# Patient Record
Sex: Female | Born: 1965 | Hispanic: No
Health system: Northeastern US, Academic
[De-identification: ages and names within clinical notes are randomized; demographics above are authoritative.]

---

## 2020-02-14 ENCOUNTER — Encounter: Admit: 2020-02-14 | Payer: BLUE CROSS/BLUE SHIELD | Attending: Dermatology

## 2020-02-18 ENCOUNTER — Ambulatory Visit: Admit: 2020-02-18 | Payer: BLUE CROSS/BLUE SHIELD | Attending: Dermatology

## 2020-02-18 ENCOUNTER — Encounter: Admit: 2020-02-18 | Payer: BLUE CROSS/BLUE SHIELD | Attending: Dermatology

## 2020-02-18 ENCOUNTER — Encounter: Admit: 2020-02-18 | Payer: PRIVATE HEALTH INSURANCE | Attending: Dermatology

## 2020-02-18 DIAGNOSIS — D239 Other benign neoplasm of skin, unspecified: Secondary | ICD-10-CM

## 2020-02-18 DIAGNOSIS — L570 Actinic keratosis: Secondary | ICD-10-CM

## 2020-02-18 DIAGNOSIS — L219 Seborrheic dermatitis, unspecified: Secondary | ICD-10-CM

## 2020-02-18 DIAGNOSIS — L814 Other melanin hyperpigmentation: Secondary | ICD-10-CM

## 2020-02-18 DIAGNOSIS — C4441 Basal cell carcinoma of skin of scalp and neck: Secondary | ICD-10-CM

## 2020-02-18 DIAGNOSIS — Z85828 Personal history of other malignant neoplasm of skin: Secondary | ICD-10-CM

## 2020-02-18 DIAGNOSIS — L738 Other specified follicular disorders: Secondary | ICD-10-CM

## 2020-02-18 DIAGNOSIS — D229 Melanocytic nevi, unspecified: Secondary | ICD-10-CM

## 2020-02-18 DIAGNOSIS — Z1283 Encounter for screening for malignant neoplasm of skin: Secondary | ICD-10-CM

## 2020-02-18 DIAGNOSIS — L821 Other seborrheic keratosis: Secondary | ICD-10-CM

## 2020-02-18 DIAGNOSIS — C4491 Basal cell carcinoma of skin, unspecified: Secondary | ICD-10-CM

## 2020-02-18 DIAGNOSIS — L578 Other skin changes due to chronic exposure to nonionizing radiation: Secondary | ICD-10-CM

## 2020-02-18 DIAGNOSIS — D1801 Hemangioma of skin and subcutaneous tissue: Secondary | ICD-10-CM

## 2020-02-18 MED ORDER — KETOCONAZOLE 2 % SHAMPOO
2 % | TOPICAL | 12 refills | Status: AC
Start: 2020-02-18 — End: ?

## 2020-02-18 MED ORDER — CLOBETASOL 0.05 % SCALP SOLUTION
0.05 % | 4 refills | Status: AC
Start: 2020-02-18 — End: ?

## 2020-02-18 NOTE — Progress Notes
CC: Chief Complaint Patient presents with ? Total Body Skin Exam   Itchy scalp present for several months  Derm Hx: last visit 02/13/2019 (Dr. Jaynie Collins) for TBSE# basal cell carcinoma on the left temporal scalp s/p MOHs sx 08/2017?2. Cherry angiomaBenign: no further treatment necessary 3. Multiple benign melanocytic nevi of upper and lower extremities and trunkBenign: no further treatment necessary4. Dermal nevusBenign: no further treatment necessary5. DermatofibromaBenign: no further treatment necessary6. Seborrheic keratosesBenign: no further treatment necessary7. Sebaceous hyperplasiaBenign: no further treatment necessary8. Solar lentigoBenign: no further treatment necessary----------------------------------------------------------------------------HPI: Melanie Hodge is a 54 y.o. female who presents for total body skin examination.The patient has a new lesion of concern.LOC #1:Anatomic Location: scalpDuration: >2 month(s)Symptom(s): itchy bump on skinChange(s): no changes; comes and goes Prior treatment(s): head and shoulders shampooAssociated signs or symptoms: noReview of Systems on February 18, 2020:Positive for rashPt was asked about and denies: weight loss, fever, chills, night sweats, cough, chest pain, shortness of breath, abdominal pain, urinary problems, diarrhea, N/V, headache, arthritis, weakness, changes in vision, swollen lymph nodes, bleeding tendencies, depression/anxiety.?Personal hx of skin cancer: basal cell carcinoma on the left temporal scalp s/p MOHs sx 08/2017?Family hx of skin cancer: nonePrior hx of blistering sunburns: noPrior hx of tanning bed use: noCurrent sunscreen use: yes The patient's medications, allergies, problem list, and past medical, surgical and family histories were reviewed and updated as appropriate.Objective:Physical ExamThe patient is a 54 y.o. female in no acute distressWell nourished, well developed. Alert and oriented x 3Fitzpatrick phototype II-III skin with moderate actinic damage.Total body skin examination performed including the hair, scalp, eyelids, ears, lips, eyes, face, neck, RUE, LUE, axillae, chest, abdomen, back, groin, buttocks, RLE, LLE, and fingernails/toenails (if no opaque nail polish). All areas were examined with inspection and/or palpation of the areas. - R occipital scalp; are of itch: 2cm minimally pink scaly patch c/w seborrheic dermatitis - Forehead: flesh colored umbilicated P c/w sebaceous hyperplasia - L temple within hairline: well healed scar, NER - L anterior distal thigh: 8mm pink brown firm P with positive dimple sign - Toenails: Opaque polish - Brown, waxy, stuck-on appearing papules on neck, trunk and extremities. Scattered symmetric brown macules and papules on face, neck, trunk and extremities. Scattered red domed papules on trunk and extremities. No other concerning lesions on examination of head, neck, chest, back, abdomen, bilateral upper or bilateral lower extremities.Key: M=macule, P=papule, IP=inflammatory papule, N=nodule, PQ=plaque,  EP=eczematous patch, L=lichenified, V=verrucous, OC=open comedone, CC=closed comedone, C=clearNurse Jenna Sedlak present during the exam. Assessment/Plan:# Total body skin examination: no biopsies# seborrheic dermatitis of the scalp- ketoconazole shampoo every other day to daily. Discussed that frequent shampooing is beneficial for treating dandruff. May alternate with OTC shampoo of patient's choice.- For scalp, clobetasol solution (depending coverage) BID x 1 week PRN itch/flaking scalp and repeat PRN- if no improvement can consider betamethasone lotion or derma-smoothe oil overnight then wash off in morning- other OTC shampoos that help remove scale include Neutrogena TSal and TGel shampoos- discussed side effects of topical steroids, including skin-thinning/atrophy, striae, telangiectasias and hypopigmentation. Patient informed not to apply to face/axillae/groin/other skin folds, and not to use daily for >14 days due to risks of side effects  # Dermatofibromas: - The benign nature of this slow-growing firm pigmented papule was discussed with the patient.- Dermatofibromas often develop at sites of previous trauma.- If any changes are noted over these papules (tenderness, enlargement or bleeding), then the patient will schedule an appointment to have evaluated and treated- If the dermatofibroma becomes tender/irritating, then potential treatments include  LN2 which may help to flatten the lesion (but will not resolve it), shave/punch biopsy (risk of recurrence) or excision.  # Sebaceous hyperplasia: - Benign enlargement of the sebaceous gland was reviewed with the patient.- Treatment options are often ineffective, especially for numerous lesions.- Destructive modalities vs vitamin A analog therapy was discussed.- If lesion(s) becomes symptomatic in any way, pt to let me know and we can biopsy/treat accordingly # Seborrheic keratoses: - Benign growth of keratinocytes reviewed with patient.- The sites will be closely observed for clinical changes.- Patient to call for re-evaluation if the keratosis bleeds, becomes tender to touch or changes in any other way.# Cherry hemangioma- Benign growth of cutaneous blood vessels was reviewed with patient.- Patient to call for re-evaluation if the cherry hemangiomas bleed, become tender to touch or change in any other way.#  Skin examination as outlined above with benign nevi, seborrheic keratoses and lentigines- no further treatment is required for these benign lesions- the patient was advised to call for re-evaluation if a change is noted in color, shape, size or if any lesion becomes symptomatic (itch, bleed, become tender)# Personal history of skin cancer: NER- no evidence of recurrence- will continue to monitor all sites of prior skin cancer during routine exams# Patient counseling on UV protection/skin cancer prevention:- the patient was counseled on increased risk of skin cancer in patients with diffuse sun damage or prior history of skin cancer, and need for regular skin examinations- the ABCDE's of melanoma were reviewed as well as self-skin examinations- the patient was educated on sun avoidance behavior including sun protective clothing, broad-rimmed hit, use of SPF 30 or above with frequent reapplication, avoiding the sun during mid-day hours; handout distributed with recommendationsRETURN: 1 year, return sooner if new lesion of concern should ariseScribed for Pollyann Savoy, MD by Miguel Aschoff, medical scribe August 9, 2021The documentation recorded by the scribe accurately reflects the services I personally performed and the decisions made by me. I reviewed and confirmed all material entered and/or pre-charted by the scribe. Pollyann Savoy, MD, PhD8/03/2020 4:23 PMElectronically Signed by Pollyann Savoy, MD, February 18, 2020 Meds: Current Outpatient Medications Medication Sig Dispense Refill ? cholecalciferol, vitamin D3, (VITAMIN D3 ORAL) Take by mouth   ? ferrous sulfate (IRON ORAL) Take 27 mg by mouth   ? MULTIVITAMIN ORAL Take by mouth   ? pravastatin (PRAVACHOL) 20 MG tablet take 1 tablet by mouth once daily  0 ? clobetasoL (TEMOVATE) 0.05 % external solution Apply nightly x 2 weeks, then decrease to twice weekly for maintenance 50 mL 3 ? ketoconazole (NIZORAL) 2 % shampoo Apply topically every other day. Lather and leave on scalp x 14m in shower, then wash out. Repeat 3 times per week. 120 mL 11 No current facility-administered medications for this visit.  Allergy: No Known AllergiesPMH:Past Medical History: Diagnosis Date ? Basal cell carcinoma  ? Basal cell carcinoma, scalp/neck 08/22/2017 ? Keratosis, actinic  FH: family history is not on file.VW:[id number] History Social History Narrative ? Not on file Tobacco Use ? Smoking status: Never Smoker ? Smokeless tobacco: Never Used Substance Use Topics ? Alcohol use: Not on file ? Drug use: Not on file

## 2020-02-18 NOTE — Progress Notes
Review of Systems on February 18, 2020:Positive for rashPt was asked about and denies: weight loss, fever, chills, night sweats, cough, chest pain, shortness of breath, abdominal pain, urinary problems, diarrhea, N/V, headache, arthritis, weakness, changes in vision, swollen lymph nodes, bleeding tendencies, depression/anxiety.Personal hx of skin cancer: basal cell carcinoma on the left temporal scalp s/p MOHs sx 08/2017?Family hx of skin cancer: nonePrior hx of blistering sunburns: noPrior hx of tanning bed use: noCurrent sunscreen use: yes

## 2020-02-18 NOTE — Patient Instructions
Dr. Alain Honey specializes in cosmetic dermatology including Botox and fillers, lasers (pulse dye laser for cherry angiomas), and dermatologic surgery. If you would like to schedule a consultation with her, please call [phone].[facility] [address], [address] [place], [place] [address]# Seborrheic dermatitis (dandruff / itchy scalp)- ketoconazole (NIZORAL) 2 % shampoo; Apply topically every other day. Lather and leave on scalp x 27m in shower, then wash out. Repeat 3 times per week.  Dispense: 120 mL; Refill: 11- clobetasoL (TEMOVATE) 0.05 % external solution; Apply nightly x 2 weeks, then decrease to twice weekly for maintenance  Dispense: 50 mL; Refill: 3 Side effects of topical steroids such as clobetasol, betamethasone, triamcinolone, mometasone, fluocinolone, hydrocortisone, and others include:?	skin-thinning (atrophy)?	stretch marks (striae)?	dilated blood vessels (telangiectasias)?	whitening/lightening of the skin (hypopigmentation)Do not apply to face/armpits/groin/other skin folds, and do not to use every day for more than 14 days in a row due to risks of side effects. Cherry AngiomaA cherry angioma is a harmless growth on the skin. It is made up of blood vessels. Cherry angiomas can appear anywhere on the body, but they usually appear on the trunk and arms.What are the causes?The cause of this condition is not known, but it seems to be related to advancing age.What increases the risk?You are more likely to develop this condition if you:?	Are over the age of 70.?	Have a family member with this condition.What are the signs or symptoms??	Symptoms of this condition include harmless growths that are:?	Smooth, round, and red or purplish-red.?	As small as the tip of a pin or as big as a pencil eraser.How is this diagnosed?This condition is diagnosed with a skin exam. Rarely, a piece of the cherry angioma may be removed for testing if it is not clear that the growth is a cherry angioma.How is this treated?Treatment is not needed for this condition. If you do not like the way a cherry angioma looks, you may have it removed. Removal methods include:?	A method where heat is used to burn the cherry angioma off the skin (electrocautery).?	A method where the cherry angioma is frozen (cryosurgery). This causes it to eventually fall off the skin.?	A method where a laser is used to destroy the red blood cells and blood vessels in the angioma (laser therapy).?	A minor surgical procedure. A scalpel is used to remove the cherry angioma off the skin.A cherry angioma may come back after it has been removed.Follow these instructions at home:?	If you have a cherry angioma removed, keep the area clean and follow any other care instructions as told by your health care provider.?	Take over-the-counter and prescription medicines only as told by your health care provider.?	Keep all follow-up visits as told by your health care provider. This is important.Summary?	A cherry angioma is a harmless growth on the skin that is made up of blood vessels.?	Treatment is not needed for this condition.?	If you do not like the way a cherry angioma looks, you may have it removed.?	If you have a cherry angioma removed, follow any care instructions as told by your health care provider.This information is not intended to replace advice given to you by your health care provider. Make sure you discuss any questions you have with your health care provider.Document Revised: 01/17/2018 Document Reviewed: 07/09/2019Elsevier Patient Education ? 2021 [employer]Sunscreen recommendations:You should choose a suscreen that is at least an SPF 30. Make sure that the sunscreen you choose has broad spectrum, written on the label, meaning it covers both UVA and UVA types of radiation. All sunscreens should be applied every 2 hours  and after swimming, toweling or sweating.Physical sunscreens contain mineral compounds like zinc oxide or titanium dioxide that sit on top of the skin and deflect UV radiation. These are the traditional sunscreens that turn you white but have excellent UV coverage. Chemical sunscreens have various active chemical compounds to protect against UVA and/or UVB.??Wynelle Link Protection Recommendations?5.	Use a daily sunscreen that is labeled as ?broad-spectrum, which covers both  UVA and UVB sun rays. The sunscreen should be at least SPF 30.?2.	Reapply the sunscreen every 2 hours when outdoors and after getting wet, sweating or toweling off.?3.	Use at least one fluid ounce (size of a golf ball) of sunscreen for every application to cover all sun-exposed areas.?4.	Don?t forget to apply sunscreen to your face, ears and neck, as well as your arms and legs if they are not covered by clothing.?5.	Seek shade between 10 a.m. and 4 p.m., which are the peak hours of UVB rays.?6.	Wear lightweight long-sleeved shirts and pants when possible. There are now numerous clothing lines that contain SPF protection built into the clothing.?7.	Wear a wide-brimmed hat and sunglasses whenever possible.?8.	Sunscreens can contain chemical sunscreens or physical blockers. Physical blockers include titanium dioxide and zinc oxide. Look at the ingredients on the bottle label to see which type of sunscreen is contained in that bottle.?9.	If using spray sunscreen, be sure that you are applying an even layer on all sun-exposed skin and rub in the sunscreen after spraying.??Moles (melanocytic nevi)?These are collections of pigment cells in the skin. They can be flat or elevated.Melanoma is a form of malignant cancer that can arise on its own or from a pre-existing mole and can ultimately lead to metastasis and even death.?To distinguish a possible melanoma from a mole use the ABCDE rules:A - Asymmetry - Most moles are symmetric (they look the same on both sides), melanomas do notB - Border irregularity - Most moles have a smooth border, melanomas may have an irregular borderC - Color variegation - Most moles have one or two colors which are symmetric (e.g. darker center lighter around the edges), melanomas may                     have several irregularly placed colors (e.g. brown, black, red, white)D - Diameter - Most moles are less than 6 mm (size of a pencil eraser), melanomas can be larger (or smaller)E - Evolution - Melanoma can grow rapidly so watch out for any rapidly growing spot, even ones without brown color (some melanomas are skin colored and some are red)?The growth of hair within moles is normal, is not concerning and it does not increase the risk of melanoma. Always protect yourself from the suns rays as it increases the risk of melanoma. Do NOT use tanning beds, they increase the risk even more!Please call for reevaluation if you develop new spots on your skin that you are concerned about, or if any of your skin spots change in color, start to bleed, become tender to touch or change in any other way.

## 2021-02-16 ENCOUNTER — Ambulatory Visit: Admit: 2021-02-16 | Payer: BLUE CROSS/BLUE SHIELD | Attending: Dermatology

## 2021-02-16 ENCOUNTER — Encounter: Admit: 2021-02-16 | Payer: PRIVATE HEALTH INSURANCE | Attending: Dermatology

## 2021-02-16 DIAGNOSIS — L219 Seborrheic dermatitis, unspecified: Secondary | ICD-10-CM

## 2021-02-16 DIAGNOSIS — L570 Actinic keratosis: Secondary | ICD-10-CM

## 2021-02-16 DIAGNOSIS — L821 Other seborrheic keratosis: Secondary | ICD-10-CM

## 2021-02-16 DIAGNOSIS — Z1283 Encounter for screening for malignant neoplasm of skin: Secondary | ICD-10-CM

## 2021-02-16 DIAGNOSIS — L738 Other specified follicular disorders: Secondary | ICD-10-CM

## 2021-02-16 DIAGNOSIS — D229 Melanocytic nevi, unspecified: Secondary | ICD-10-CM

## 2021-02-16 DIAGNOSIS — C4441 Basal cell carcinoma of skin of scalp and neck: Secondary | ICD-10-CM

## 2021-02-16 DIAGNOSIS — L814 Other melanin hyperpigmentation: Secondary | ICD-10-CM

## 2021-02-16 DIAGNOSIS — Z85828 Personal history of other malignant neoplasm of skin: Secondary | ICD-10-CM

## 2021-02-16 DIAGNOSIS — C4491 Basal cell carcinoma of skin, unspecified: Secondary | ICD-10-CM

## 2021-02-16 DIAGNOSIS — D1801 Hemangioma of skin and subcutaneous tissue: Secondary | ICD-10-CM

## 2021-02-16 DIAGNOSIS — L578 Other skin changes due to chronic exposure to nonionizing radiation: Secondary | ICD-10-CM

## 2021-02-16 DIAGNOSIS — D239 Other benign neoplasm of skin, unspecified: Secondary | ICD-10-CM

## 2021-02-16 NOTE — Patient Instructions
Dr. Alain Honey specializes in cosmetic dermatology including Botox and fillers, lasers, and dermatologic surgery. If you would like to schedule a consultation with her, please call [phone]. Pulse Dye laser for cherry angiomas. [facility] Dermatologic Surgery [facility] [address], [address] [place], [place] [id number] recommendations:You should choose a suscreen that is at least an SPF 30. Make sure that the sunscreen you choose has broad spectrum, written on the label, meaning it covers both UVA and UVA types of radiation. All sunscreens should be applied every 2 hours and after swimming, toweling or sweating.Physical sunscreens contain mineral compounds like zinc oxide or titanium dioxide that sit on top of the skin and deflect UV radiation. These are the traditional sunscreens that turn you white but have excellent UV coverage. Chemical sunscreens have various active chemical compounds to protect against UVA and/or UVB.  Sun Protection Recommendations Use a daily sunscreen that is labeled as ?broad-spectrum, which covers both  UVA and UVB sun rays. The sunscreen should be at least SPF 30. Reapply the sunscreen every 2 hours when outdoors and after getting wet, sweating or toweling off. Use at least one fluid ounce (size of a golf ball) of sunscreen for every application to cover all sun-exposed areas. Don?t forget to apply sunscreen to your face, ears and neck, as well as your arms and legs if they are not covered by clothing. Seek shade between 10 a.m. and 4 p.m., which are the peak hours of UVB rays. Wear lightweight long-sleeved shirts and pants when possible. There are now numerous clothing lines that contain SPF protection built into the clothing. Wear a wide-brimmed hat and sunglasses whenever possible. Sunscreens can contain chemical sunscreens or physical blockers. Physical blockers include titanium dioxide and zinc oxide. Look at the ingredients on the bottle label to see which type of sunscreen is contained in that bottle. If using spray sunscreen, be sure that you are applying an even layer on all sun-exposed skin and rub in the sunscreen after spraying.  Moles (melanocytic nevi) These are collections of pigment cells in the skin. They can be flat or elevated.Melanoma is a form of malignant cancer that can arise on its own or from a pre-existing mole and can ultimately lead to metastasis and even death. To distinguish a possible melanoma from a mole use the ABCDE rules:A - Asymmetry - Most moles are symmetric (they look the same on both sides), melanomas do notB - Border irregularity - Most moles have a smooth border, melanomas may have an irregular borderC - Color variegation - Most moles have one or two colors which are symmetric (e.g. darker center lighter around the edges), melanomas may                     have several irregularly placed colors (e.g. brown, black, red, white)D - Diameter - Most moles are less than 6 mm (size of a pencil eraser), melanomas can be larger (or smaller)E - Evolution - Melanoma can grow rapidly so watch out for any rapidly growing spot, even ones without brown color (some melanomas are skin colored and some are red) The growth of hair within moles is normal, is not concerning and it does not increase the risk of melanoma. Always protect yourself from the suns rays as it increases the risk of melanoma. Do NOT use tanning beds, they increase the risk even more!Please call for reevaluation if you develop new spots on your skin that you are concerned about, or if  any of your skin spots change in color, start to bleed, become tender to touch or change in any other way.

## 2021-02-16 NOTE — Progress Notes
CC: Chief Complaint Patient presents with ? Total Body Skin Exam Derm Hx: last visit 02/18/2020 (Dr. 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 Discussed that frequent shampooing is beneficial for treating dandruff. May alternate with OTC shampoo of patient's choice.- For scalp, clobetasol solution BID x 1 week PRN itch/flaking scalp and repeat PRN- if no improvement can consider betamethasone lotion or derma-smoothe oil overnight then wash off in morning- other OTC shampoos that help remove scale include Neutrogena TSal and TGel shampoos- discussed side effects of topical steroids, including skin-thinning/atrophy, striae, telangiectasias and hypopigmentation. Patient informed not to apply to face/axillae/groin/other skin folds, and not to use daily for >14 days due to risks of side effects  # Dermatofibromas# Sebaceous hyperplasia# Seborrheic keratoses, CHs----------------------------------------------------------------------------HPI: Melanie Hodge is a 55 y.o. female who presents for total body skin examination.Today:- no specific lesions of concern today- has not used ketoconazole for months, feels that the ketoconazole shampoo plus the clobetasol solution worked very well after last visit to resolve the scaly and itchy areas on the scalp, has not needed them sinceReview of Systems on February 16, 2021:Positive for rashPt was asked about and denies: weight loss, fever, chills, night sweats, cough, chest pain, shortness of breath, abdominal pain, urinary problems, diarrhea, N/V, headache, arthritis, weakness, changes in vision, swollen lymph nodes, bleeding tendencies, depression/anxiety.?Personal hx of skin cancer: basal cell carcinoma on the left temporal scalp s/p [facility] 08/2017?Family hx of skin cancer: nonePrior hx of blistering sunburns: noPrior hx of tanning bed use: noCurrent sunscreen use: yes The patient's medications, allergies, problem list, and past medical, surgical and family histories were reviewed and updated as appropriate.Objective:Physical ExamThe patient is a 55 y.o. female in no acute distressWell nourished, well developed. Alert and oriented x 3Fitzpatrick phototype II-III skin with moderate actinic damage.Total body skin examination performed including the hair, scalp, eyelids, ears, lips, eyes, face, neck, RUE, LUE, axillae, chest, abdomen, back, groin, buttocks, RLE, LLE, and fingernails/toenails (if no opaque nail polish). All areas were examined with inspection and/or palpation of the areas. - R occipital scalp: clear- opaque finger and toenail polish- left temple within the hairline: well healed scar, NER- forehead: pink, yellow, umbilicated papules, c/w seb hyp- chest: cherry angiomas - anterior distal thigh: 8 mm pink, brown firm papule c/w DF- left lateral thigh: 9 mm pink, brown, firm P with positive dimple sign, c/w DF- right lateral ankle: 4mm angulate erosion c/w excoriation - Brown, waxy, stuck-on appearing papules on neck, trunk and extremities. Scattered symmetric brown macules and papules on face, neck, trunk and extremities. Scattered red domed papules on trunk and extremities. No other concerning lesions on examination of head, neck, chest, back, abdomen, bilateral upper or bilateral lower extremities.Key: M=macule, P=papule, IP=inflammatory papule, N=nodule, PQ=plaque,  EP=eczematous patch, L=lichenified, V=verrucous, OC=open comedone, CC=closed comedone, C=clearNurse Meagan Marroquin was present in room for physical exam.Assessment/Plan:# Total body skin examination: no biopsies# seborrheic dermatitis of the scalp, currently resolvedIF flares, okay to restart:- ketoconazole shampoo every other day to daily. Discussed that frequent shampooing is beneficial for treating dandruff. May alternate with OTC shampoo of patient's choice.- For scalp, clobetasol solution (depending coverage) BID x 1 week PRN itch/flaking scalp and repeat PRN- if no improvement can consider betamethasone lotion or derma-smoothe oil overnight then wash off in morning- other OTC shampoos that help remove scale include Neutrogena TSal and TGel shampoos- discussed side effects of topical steroids, including skin-thinning/atrophy,  striae, telangiectasias and hypopigmentation. Patient informed not to apply to face/axillae/groin/other skin folds, and not to use daily for >14 days due to risks of side effects  # Dermatofibromas: - The benign nature of this slow-growing firm pigmented papule was discussed with the patient.- Dermatofibromas often develop at sites of previous trauma.- If any changes are noted over these papules (tenderness, enlargement or bleeding), then the patient will schedule an appointment to have evaluated and treated- If the dermatofibroma becomes tender/irritating, then potential treatments include  LN2 which may help to flatten the lesion (but will not resolve it), shave/punch biopsy (risk of recurrence) or excision.  # Sebaceous hyperplasia: - Benign enlargement of the sebaceous gland was reviewed with the patient.- Treatment options are often ineffective, especially for numerous lesions.- Destructive modalities vs vitamin A analog therapy was discussed.- If lesion(s) becomes symptomatic in any way, pt to let me know and we can biopsy/treat accordingly # Seborrheic keratoses: - Benign growth of keratinocytes reviewed with patient.- The sites will be closely observed for clinical changes.- Patient to call for re-evaluation if the keratosis bleeds, becomes tender to touch or changes in any other way.# Cherry hemangioma- Benign growth of cutaneous blood vessels was reviewed with patient.- Patient to call for re-evaluation if the cherry hemangiomas bleed, become tender to touch or change in any other way.- information regarding pulsed dye laser provided#  Skin examination as outlined above with benign nevi, seborrheic keratoses and lentigines- no further treatment is required for these benign lesions- the patient was advised to call for re-evaluation if a change is noted in color, shape, size or if any lesion becomes symptomatic (itch, bleed, become tender)# Personal history of skin cancer: NER- no evidence of recurrence- will continue to monitor all sites of prior skin cancer during routine exams# Patient counseling on UV protection/skin cancer prevention:- the patient was counseled on increased risk of skin cancer in patients with diffuse sun damage or prior history of skin cancer, and need for regular skin examinations- the ABCDE's of melanoma were reviewed as well as self-skin examinations- the patient was educated on sun avoidance behavior including sun protective clothing, broad-rimmed hit, use of SPF 30 or above with frequent reapplication, avoiding the sun during mid-day hours; handout distributed with recommendationsRETURN: 1 year, return sooner if new lesion of concern should ariseScribed for Pollyann Savoy, MD by Elizebeth Brooking, medical scribe August 8, 2022The documentation recorded by the scribe accurately reflects the services I personally performed and the decisions made by me. I reviewed and confirmed all material entered and/or pre-charted by the scribe.Scribed for Pollyann Savoy, MD by Sande Brothers, medical scribe August 8, 2022The documentation recorded by the scribe accurately reflects the services I personally performed and the decisions made by me. I reviewed and confirmed all material entered and/or pre-charted by the scribe. Pollyann Savoy, MD, PhD8/02/2021 4:28 PMElectronically Signed by Pollyann Savoy, MD, February 16, 2021 Meds: Current Outpatient Medications Medication Sig Dispense Refill ? cholecalciferol, vitamin D3, (VITAMIN D3 ORAL) Take by mouth   ? clobetasoL (TEMOVATE) 0.05 % external solution Apply nightly x 2 weeks, then decrease to twice weekly for maintenance 50 mL 3 ? ferrous sulfate (IRON ORAL) Take 27 mg by mouth   ? ketoconazole (NIZORAL) 2 % shampoo Apply topically every other day. Lather and leave on scalp x 51m in shower, then wash out. Repeat 3 times per week. 120 mL 11 ? MULTIVITAMIN ORAL Take by mouth   ? pravastatin (PRAVACHOL) 20 MG tablet take 1 tablet by mouth once  daily  0 No current facility-administered medications for this visit. Allergy: No Known AllergiesPMH:Past Medical History: Diagnosis Date ? Basal cell carcinoma  ? Basal cell carcinoma, scalp/neck 08/22/2017 ? Keratosis, actinic  FH: family history is not on file.ZO:XWRUEA History Social History Narrative ? Not on file Tobacco Use ? Smoking status: Never Smoker ? Smokeless tobacco: Never Used Vaping Use ? Vaping Use: Never used Substance Use Topics ? Alcohol use: Not on file ? Drug use: Not on file

## 2021-02-16 NOTE — Progress Notes
Review of Systems on February 16, 2021:Positive for rashPt was asked about and denies: weight loss, fever, chills, night sweats, cough, chest pain, shortness of breath, abdominal pain, urinary problems, diarrhea, N/V, headache, arthritis, weakness, changes in vision, swollen lymph nodes, bleeding tendencies, depression/anxiety.Personal hx of skin cancer: basal cell carcinoma on the left temporal scalp s/p [facility] 08/2017?Family hx of skin cancer: nonePrior hx of blistering sunburns: noPrior hx of tanning bed use: noCurrent sunscreen use: yes

## 2022-02-15 ENCOUNTER — Encounter: Admit: 2022-02-15 | Payer: PRIVATE HEALTH INSURANCE | Attending: Dermatology

## 2022-02-15 ENCOUNTER — Ambulatory Visit: Admit: 2022-02-15 | Payer: PRIVATE HEALTH INSURANCE | Attending: Dermatology

## 2022-02-15 DIAGNOSIS — D1801 Hemangioma of skin and subcutaneous tissue: Secondary | ICD-10-CM

## 2022-02-15 DIAGNOSIS — L814 Other melanin hyperpigmentation: Secondary | ICD-10-CM

## 2022-02-15 DIAGNOSIS — L570 Actinic keratosis: Secondary | ICD-10-CM

## 2022-02-15 DIAGNOSIS — D229 Melanocytic nevi, unspecified: Secondary | ICD-10-CM

## 2022-02-15 DIAGNOSIS — C4491 Basal cell carcinoma of skin, unspecified: Secondary | ICD-10-CM

## 2022-02-15 DIAGNOSIS — Z85828 Personal history of other malignant neoplasm of skin: Secondary | ICD-10-CM

## 2022-02-15 DIAGNOSIS — D239 Other benign neoplasm of skin, unspecified: Secondary | ICD-10-CM

## 2022-02-15 DIAGNOSIS — L821 Other seborrheic keratosis: Secondary | ICD-10-CM

## 2022-02-15 DIAGNOSIS — C4441 Basal cell carcinoma of skin of scalp and neck: Secondary | ICD-10-CM

## 2022-02-15 DIAGNOSIS — L738 Other specified follicular disorders: Secondary | ICD-10-CM

## 2022-02-15 NOTE — Patient Instructions
Sunscreen recommendations:You should choose a suscreen that is at least an SPF 30. Make sure that the sunscreen you choose has broad spectrum, written on the label, meaning it covers both UVA and UVA types of radiation. All sunscreens should be applied every 2 hours and after swimming, toweling or sweating.Physical sunscreens contain mineral compounds like zinc oxide or titanium dioxide that sit on top of the skin and deflect UV radiation. These are the traditional sunscreens that turn you white but have excellent UV coverage. Chemical sunscreens have various active chemical compounds to protect against UVA and/or UVB.  Sun Protection Recommendations Use a daily sunscreen that is labeled as ?broad-spectrum, which covers both  UVA and UVB sun rays. The sunscreen should be at least SPF 30. Reapply the sunscreen every 2 hours when outdoors and after getting wet, sweating or toweling off. Use at least one fluid ounce (size of a golf ball) of sunscreen for every application to cover all sun-exposed areas. Don?t forget to apply sunscreen to your face, ears and neck, as well as your arms and legs if they are not covered by clothing. Seek shade between 10 a.m. and 4 p.m., which are the peak hours of UVB rays. Wear lightweight long-sleeved shirts and pants when possible. There are now numerous clothing lines that contain SPF protection built into the clothing. Wear a wide-brimmed hat and sunglasses whenever possible. Sunscreens can contain chemical sunscreens or physical blockers. Physical blockers include titanium dioxide and zinc oxide. Look at the ingredients on the bottle label to see which type of sunscreen is contained in that bottle. If using spray sunscreen, be sure that you are applying an even layer on all sun-exposed skin and rub in the sunscreen after spraying.  Moles (melanocytic nevi) These are collections of pigment cells in the skin. They can be flat or elevated.Melanoma is a form of malignant cancer that can arise on its own or from a pre-existing mole and can ultimately lead to metastasis and even death. To distinguish a possible melanoma from a mole use the ABCDE rules:A - Asymmetry - Most moles are symmetric (they look the same on both sides), melanomas do notB - Border irregularity - Most moles have a smooth border, melanomas may have an irregular borderC - Color variegation - Most moles have one or two colors which are symmetric (e.g. darker center lighter around the edges), melanomas may                     have several irregularly placed colors (e.g. brown, black, red, white)D - Diameter - Most moles are less than 6 mm (size of a pencil eraser), melanomas can be larger (or smaller)E - Evolution - Melanoma can grow rapidly so watch out for any rapidly growing spot, even ones without brown color (some melanomas are skin colored and some are red) The growth of hair within moles is normal, is not concerning and it does not increase the risk of melanoma. Always protect yourself from the suns rays as it increases the risk of melanoma. Do NOT use tanning beds, they increase the risk even more!Please call for reevaluation if you develop new spots on your skin that you are concerned about, or if any of your skin spots change in color, start to bleed, become tender to touch or change in any other way.

## 2022-02-15 NOTE — Progress Notes
Review of Systems on February 15, 2022:Pt was asked about and denies: weight loss, fever, chills, night sweats, cough, chest pain, shortness of breath, abdominal pain, urinary problems, diarrhea, N/V, headache, arthritis, weakness, changes in vision, swollen lymph nodes, bleeding tendencies, depression/anxiety.Personal hx of skin cancer: ?	BCC, L temporal scalp s/p [facility] 08/2017?Family hx of skin cancer: noPrior hx of blistering sunburns: noPrior hx of tanning bed use: noCurrent sunscreen use: yes, SPF 30+

## 2022-02-15 NOTE — Progress Notes
CC: Chief Complaint Patient presents with ? Total Body Skin Exam   NO loc  Derm Hx: last visit 02/16/2021 (Dr. Clarene Duke) for TBSE# basal cell carcinoma on the left temporal scalp s/p [facility] 08/2017?# seborrheic dermatitis of the scalp- ketoconazole shampoo QOD to QD. Discussed that frequent shampooing is beneficial for treating dandruff. May alternate with OTC shampoo of patient's choice.- For scalp, clobetasol solution (depending coverage) BID x 1 week PRN itch/flaking scalp and repeat PRN- if no improvement can consider betamethasone lotion or derma-smoothe oil overnight then wash off in morning- other OTC shampoos that help remove scale include Neutrogena TSal and TGel shampoos# Dermatofibromas# Sebaceous hyperplasia# Seborrheic keratoses, CHs----------------------------------------------------------------------------HPI: Melanie Hodge is a 56 y.o. female who presents for total body skin examination.Today: She has a LOC on the right cheek. LOC # 1Anatomic Location: Right cheekDuration: >2 month(s)Symptom(s): asymptomatic bump on skinChange(s): NonePrior treatment(s): NoneAssociated signs or symptoms: noReview of Systems on February 15, 2022:Pt was asked about and denies: weight loss, fever, chills, night sweats, cough, chest pain, shortness of breath, abdominal pain, urinary problems, diarrhea, N/V, headache, arthritis, weakness, changes in vision, swollen lymph nodes, bleeding tendencies, depression/anxiety.?Personal hx of skin cancer: ?	BCC, L temporal scalp s/p [facility] 08/2017?Family hx of skin cancer: noPrior hx of blistering sunburns: noPrior hx of tanning bed use: noCurrent sunscreen use: yes, SPF 30+The patient's medications, allergies, problem list, and past medical, surgical and family histories were reviewed and updated as appropriate.Objective:Physical ExamThe patient is a 56 y.o. female in no acute distressWell nourished, well developed. Alert and oriented x 3Fitzpatrick phototype II-III skin with moderate actinic damage.Total body skin examination performed including the hair, scalp, eyelids, ears, lips, eyes, face, neck, RUE, LUE, axillae, chest, abdomen, back, groin, buttocks, RLE, LLE, and fingernails/toenails (if no opaque nail polish). All areas were examined with inspection and/or palpation of the areas. - L temple: well-healed scar. NER. - R lateral zygoma: 2.5 mm round smooth papule with terminal hairs within c/w nevus- R lateral cheek: 9 mm tan stuck-on papule c/w SK (LOC)- Forehead: scattered umbilicated pink yellow papules c/w Seb Hyp - L lateral forehead: 3 mm pick acneiform papule [facility] SITE)  - L anterior distal thigh: 0.8 cm round firm papule, positive dimple sign c/w DF (unchanged)- R1 toenail: thickening and dystrophy. Unchanged for years per patient after trauma- R lateral ankle: C- Scattered tan and light brown brown, waxy, stuck-on appearing papules on neck, trunk and extremities. Scattered symmetric brown macules and papules on face, neck, trunk and extremities. Scattered red domed papules on trunk and extremities.Key: M=macule, P=papule, IP=inflammatory papule, N=nodule, PQ=plaque,  EP=eczematous patch, L=lichenified, V=verrucous, OC=open comedone, CC=closed comedone, C=clearNurse Jenna Sedlak present during the exam. Assessment/Plan:# Total body skin examination: no biopsies# Watch site, L forehead, clinically consistent with acne/inflammatory papule (pt reports the lesion just recently appeared). Options discussed included monitoring with reevaluation in a few months (pt to call for sooner reevaluation if lesion is growing or changing) vs thin shave biopsy today. Patient prefers monitoring.- anticipate lesion will resolve in 2-4 weeks; pt to contact the office if not resolved- Patient to call for re-evaluation if the spot changes in color, bleeds, becomes tender to touch or changes in any other way.# seborrheic dermatitis of the scalp, currently resolvedIF flares, okay to restart:- ketoconazole shampoo every other day to daily. Discussed that frequent shampooing is beneficial for treating dandruff. May alternate with OTC shampoo of patient's choice.- For scalp, clobetasol solution (depending coverage) BID x 1 week PRN itch/flaking scalp and repeat PRN- if no improvement  can consider betamethasone lotion or derma-smoothe oil overnight then wash off in morning- other OTC shampoos that help remove scale include Neutrogena TSal and TGel shampoos- discussed side effects of topical steroids, including skin-thinning/atrophy, striae, telangiectasias and hypopigmentation. Patient informed not to apply to face/axillae/groin/other skin folds, and not to use daily for >14 days due to risks of side effects  # Dermatofibromas: - The benign nature of this slow-growing firm pigmented papule was discussed with the patient.- Dermatofibromas often develop at sites of previous trauma.- If any changes are noted over these papules (tenderness, enlargement or bleeding), then the patient will schedule an appointment to have evaluated and treated- If the dermatofibroma becomes tender/irritating, then potential treatments include  LN2 which may help to flatten the lesion (but will not resolve it), shave/punch biopsy (risk of recurrence) or excision.  # Sebaceous hyperplasia: - Benign enlargement of the sebaceous gland was reviewed with the patient.- Treatment options are often ineffective, especially for numerous lesions.- Destructive modalities vs vitamin A analog therapy was discussed.- If lesion(s) becomes symptomatic in any way, pt to let me know and we can biopsy/treat accordingly # Seborrheic keratoses: - Benign growth of keratinocytes reviewed with patient.- The sites will be closely observed for clinical changes.- Patient to call for re-evaluation if the keratosis bleeds, becomes tender to touch or changes in any other way.# Cherry hemangioma- Benign growth of cutaneous blood vessels was reviewed with patient.- Patient to call for re-evaluation if the cherry hemangiomas bleed, become tender to touch or change in any other way.- information regarding pulsed dye laser provided#  Skin examination as outlined above with benign nevi, seborrheic keratoses and lentigines- no further treatment is required for these benign lesions- the patient was advised to call for re-evaluation if a change is noted in color, shape, size or if any lesion becomes symptomatic (itch, bleed, become tender)# Personal history of skin cancer: NER- no evidence of recurrence- will continue to monitor all sites of prior skin cancer during routine exams# Patient counseling on UV protection/skin cancer prevention:- the patient was counseled on increased risk of skin cancer in patients with diffuse sun damage or prior history of skin cancer, and need for regular skin examinations- the ABCDE's of melanoma were reviewed as well as self-skin examinations- the patient was educated on sun avoidance behavior including sun protective clothing, broad-rimmed hit, use of SPF 30 or above with frequent reapplication, avoiding the sun during mid-day hours; handout distributed with recommendationsRETURN: 1 year, return sooner if new lesion of concern should ariseScribed for Pollyann Savoy, MD by Earvin Hansen, medical scribe August 7, 2023The documentation recorded by the scribe accurately reflects the services I personally performed and the decisions made by me. I reviewed and confirmed all material entered and/or pre-charted by the scribe. Pollyann Savoy, MD, PhD8/01/2022 6:59 PMElectronically Signed by Pollyann Savoy, MD, August 7, 2023Meds: Current Outpatient Medications Medication Sig Dispense Refill ? cholecalciferol, vitamin D3, (VITAMIN D3 ORAL) Take by mouth   ? clobetasoL (TEMOVATE) 0.05 % external solution Apply nightly x 2 weeks, then decrease to twice weekly for maintenance 50 mL 3 ? ferrous sulfate (IRON ORAL) Take 27 mg by mouth   ? ketoconazole (NIZORAL) 2 % shampoo Apply topically every other day. Lather and leave on scalp x 71m in shower, then wash out. Repeat 3 times per week. 120 mL 11 ? MULTIVITAMIN ORAL Take by mouth   ? pravastatin (PRAVACHOL) 20 MG tablet take 1 tablet by mouth once daily  0 No current facility-administered medications for this  visit. Allergy: No Known AllergiesPMH:Past Medical History: Diagnosis Date ? Basal cell carcinoma  ? Basal cell carcinoma, scalp/neck 08/22/2017 ? Keratosis, actinic  FH: family history is not on file.ZO:XWRUEA History Social History Narrative ? Not on file Tobacco Use ? Smoking status: Never ? Smokeless tobacco: Never Vaping Use ? Vaping Use: Never used Substance Use Topics ? Alcohol use: Not on file ? Drug use: Not on file

## 2023-02-21 ENCOUNTER — Ambulatory Visit: Admit: 2023-02-21 | Payer: PRIVATE HEALTH INSURANCE | Attending: Dermatology

## 2023-02-21 DIAGNOSIS — Z1283 Encounter for screening for malignant neoplasm of skin: Secondary | ICD-10-CM

## 2023-02-21 DIAGNOSIS — L578 Other skin changes due to chronic exposure to nonionizing radiation: Secondary | ICD-10-CM

## 2023-02-21 DIAGNOSIS — L821 Other seborrheic keratosis: Secondary | ICD-10-CM

## 2023-02-21 DIAGNOSIS — D1801 Hemangioma of skin and subcutaneous tissue: Secondary | ICD-10-CM

## 2023-02-21 DIAGNOSIS — D229 Melanocytic nevi, unspecified: Secondary | ICD-10-CM

## 2023-02-21 DIAGNOSIS — Z85828 Personal history of other malignant neoplasm of skin: Secondary | ICD-10-CM

## 2023-02-21 DIAGNOSIS — D239 Other benign neoplasm of skin, unspecified: Secondary | ICD-10-CM

## 2023-02-21 DIAGNOSIS — L814 Other melanin hyperpigmentation: Secondary | ICD-10-CM

## 2023-02-21 NOTE — Progress Notes
CC: Chief Complaint Patient presents with  Total Body Skin Exam Derm Hx: last visit 02/15/2022 (Dr. Clarene Duke) for TBSE# basal cell carcinoma on the left temporal scalp s/p [facility] 08/2017 # seborrheic dermatitis of the scalpPRN flares, may re-start- ketoconazole shampoo QOD to QD. Discussed that frequent shampooing is beneficial for treating dandruff. May alternate with OTC shampoo of patient's choice.- For scalp, clobetasol solution (depending coverage) BID x 1 week PRN itch/flaking scalp and repeat PRN- if no improvement can consider betamethasone lotion or derma-smoothe oil overnight then wash off in morning- other OTC shampoos that help remove scale include Neutrogena TSal and TGel shampoos# Watch site, L forehead, clinically consistent with acne/inflammatory papule (pt reports the lesion just recently appeared). Options discussed included monitoring with reevaluation in a few months (pt to call for sooner reevaluation if lesion is growing or changing) vs thin shave biopsy today. Patient prefers monitoring.- anticipate lesion will resolve in 2-4 weeks; pt to contact the office if not resolved- Patient to call for re-evaluation if the spot changes in color, bleeds, becomes tender to touch or changes in any other way.# Dermatofibromas# Sebaceous hyperplasia# Seborrheic keratoses, CHs----------------------------------------------------------------------------HPI: Melanie Hodge is a 57 y.o. female who presents for total body skin examination.Today: The patient has no new lesion of concern. Her seborrheic dermatitis of the scalp has remained resolved since last visit. Not using any topicals.Spot on L forehead has resolved.Review of Systems on February 21, 2023:Pt was asked about and denies: weight loss, fever, chills, night sweats, cough, chest pain, shortness of breath, abdominal pain, urinary problems, diarrhea, N/V, headache, arthritis, weakness, changes in vision, swollen lymph nodes, bleeding tendencies, depression/anxiety. Personal hx of skin cancer: BCC, L temporal scalp s/p [facility] 08/2017 Family hx of skin cancer: noPrior hx of blistering sunburns: noPrior hx of tanning bed use: noCurrent sunscreen use:  yes, SPF 30+   The patient's medications, allergies, problem list, and past medical, surgical and family histories were reviewed and updated as appropriate.Objective:Physical ExamThe patient is a 57 y.o. female in no acute distressWell nourished, well developed. Alert and oriented x 3Fitzpatrick phototype II-III skin with moderate actinic damage.Total body skin examination performed including the hair, scalp, eyelids, ears, lips, eyes, face, neck, RUE, LUE, axillae, chest, abdomen, back, groin, buttocks, RLE, LLE, and fingernails/toenails (if no opaque nail polish). All areas were examined with inspection and/or palpation of the areas. - L lateral forehead: C- L temple/frontal hairline: well-healed scar, NER - R lateral zygoma: 2 mm brown thin smooth P c/w nevus (unchanged)- L lateral thigh and L anterior thigh: three 6-9 mm pink-brown firm P's with + dimple sign c/w DFs- Toenails: opaque nail polish - Scattered brown, waxy, stuck-on appearing papules on neck, trunk and extremities. Scattered symmetric brown macules and papules on face, neck, trunk and extremities. Scattered red domed papules on trunk and extremities.Key: M=macule, P=papule, IP=inflammatory papule, N=nodule, PQ=plaque,  EP=eczematous patch, L=lichenified, V=verrucous, OC=open comedone, CC=closed comedone, C=clearNurse Melanie Hodge was present for the physical examAssessment/Plan:# Total body skin examination: no biopsies# Watch site, L forehead- Resolved# seborrheic dermatitis of the scalp, currently resolvedIF flares, okay to restart:- ketoconazole shampoo every other day to daily. Discussed that frequent shampooing is beneficial for treating dandruff. May alternate with OTC shampoo of patient's choice.- For scalp, clobetasol solution (depending coverage) BID x 1 week PRN itch/flaking scalp and repeat PRN- if no improvement can consider betamethasone lotion or derma-smoothe oil overnight then wash off in morning- other OTC shampoos that help remove scale include Neutrogena TSal and TGel shampoos- discussed side  effects of topical steroids, including skin-thinning/atrophy, striae, telangiectasias and hypopigmentation. Patient informed not to apply to face/axillae/groin/other skin folds, and not to use daily for >14 days due to risks of side effects  # Dermatofibromas: - The benign nature of this slow-growing firm pigmented papule was discussed with the patient.- Dermatofibromas often develop at sites of previous trauma.- If any changes are noted over these papules (tenderness, enlargement or bleeding), then the patient will schedule an appointment to have evaluated and treated- If the dermatofibroma becomes tender/irritating, then potential treatments include  LN2 which may help to flatten the lesion (but will not resolve it), shave/punch biopsy (risk of recurrence) or excision.  # Seborrheic keratoses: - Benign growth of keratinocytes reviewed with patient.- The sites will be closely observed for clinical changes.- Patient to call for re-evaluation if the keratosis bleeds, becomes tender to touch or changes in any other way.# Cherry hemangioma- Benign growth of cutaneous blood vessels was reviewed with patient.- Patient to call for re-evaluation if the cherry hemangiomas bleed, become tender to touch or change in any other way.- information regarding pulsed dye laser provided#  Skin examination as outlined above with benign nevi, seborrheic keratoses and lentigines- no further treatment is required for these benign lesions- the patient was advised to call for re-evaluation if a change is noted in color, shape, size or if any lesion becomes symptomatic (itch, bleed, become tender)# Personal history of skin cancer: NER- no evidence of recurrence- will continue to monitor all sites of prior skin cancer during routine exams# Patient counseling on UV protection/skin cancer prevention:- the patient was counseled on increased risk of skin cancer in patients with diffuse sun damage or prior history of skin cancer, and need for regular skin examinations- the ABCDE's of melanoma were reviewed as well as self-skin examinations- the patient was educated on sun avoidance behavior including sun protective clothing, broad-rimmed hit, use of SPF 30 or above with frequent reapplication, avoiding the sun during mid-day hours; handout distributed with recommendationsRETURN: 12 months for TBSE ([facility]) per pt preference, or sooner if neededScribed for Pollyann Savoy, MD by Larene Beach, medical scribe August 11, 2024The documentation recorded by the scribe accurately reflects the services I personally performed and the decisions made by me. I reviewed and confirmed all material entered and/or pre-charted by the scribe.Pollyann Savoy, MD, PhD8/06/2023 4:17 PMElectronically Signed by Pollyann Savoy, MD, February 21, 2023 Meds: Current Outpatient Medications Medication Sig Dispense Refill  cholecalciferol, vitamin D3, (VITAMIN D3 ORAL) Take by mouth    clobetasoL (TEMOVATE) 0.05 % external solution Apply nightly x 2 weeks, then decrease to twice weekly for maintenance 50 mL 3  ferrous sulfate (IRON ORAL) Take 27 mg by mouth    ketoconazole (NIZORAL) 2 % shampoo Apply topically every other day. Lather and leave on scalp x 49m in shower, then wash out. Repeat 3 times per week. 120 mL 11  MULTIVITAMIN ORAL Take by mouth    pravastatin (PRAVACHOL) 20 MG tablet take 1 tablet by mouth once daily  0 No current facility-administered medications for this visit. Allergy: No Known AllergiesPMH:Past Medical History: Diagnosis Date  Basal cell carcinoma   Basal cell carcinoma, scalp/neck 08/22/2017  Keratosis, actinic  FH: family history is not on file.[id number] History Social History Narrative  Not on file Tobacco Use  Smoking status: Never  Smokeless tobacco: Never Vaping Use  Vaping Use: Never used Substance Use Topics  Alcohol use: Not on file  Drug use: Not on file

## 2023-02-21 NOTE — Progress Notes
Review of Systems on February 21, 2023:Pt was asked about and denies: weight loss, fever, chills, night sweats, cough, chest pain, shortness of breath, abdominal pain, urinary problems, diarrhea, N/V, headache, arthritis, weakness, changes in vision, swollen lymph nodes, bleeding tendencies, depression/anxiety.Personal hx of skin cancer: BCC, L temporal scalp s/p [facility] 08/2017 Family hx of skin cancer: noPrior hx of blistering sunburns: noPrior hx of tanning bed use: noCurrent sunscreen use:  yes, SPF 30+

## 2023-02-21 NOTE — Patient Instructions
Sunscreen recommendations:You should choose a suscreen that is at least an SPF 30. Make sure that the sunscreen you choose has broad spectrum, written on the label, meaning it covers both UVA and UVA types of radiation. All sunscreens should be applied every 2 hours and after swimming, toweling or sweating.Physical sunscreens contain mineral compounds like zinc oxide or titanium dioxide that sit on top of the skin and deflect UV radiation. These are the traditional sunscreens that turn you white but have excellent UV coverage. Chemical sunscreens have various active chemical compounds to protect against UVA and/or UVB.  Sun Protection Recommendations Use a daily sunscreen that is labeled as ?broad-spectrum, which covers both  UVA and UVB sun rays. The sunscreen should be at least SPF 30. Reapply the sunscreen every 2 hours when outdoors and after getting wet, sweating or toweling off. Use at least one fluid ounce (size of a golf ball) of sunscreen for every application to cover all sun-exposed areas. Don?t forget to apply sunscreen to your face, ears and neck, as well as your arms and legs if they are not covered by clothing. Seek shade between 10 a.m. and 4 p.m., which are the peak hours of UVB rays. Wear lightweight long-sleeved shirts and pants when possible. There are now numerous clothing lines that contain SPF protection built into the clothing. Wear a wide-brimmed hat and sunglasses whenever possible. Sunscreens can contain chemical sunscreens or physical blockers. Physical blockers include titanium dioxide and zinc oxide. Look at the ingredients on the bottle label to see which type of sunscreen is contained in that bottle. If using spray sunscreen, be sure that you are applying an even layer on all sun-exposed skin and rub in the sunscreen after spraying.  Moles (melanocytic nevi) These are collections of pigment cells in the skin. They can be flat or elevated.Melanoma is a form of malignant cancer that can arise on its own or from a pre-existing mole and can ultimately lead to metastasis and even death. To distinguish a possible melanoma from a mole use the ABCDE rules:A - Asymmetry - Most moles are symmetric (they look the same on both sides), melanomas do notB - Border irregularity - Most moles have a smooth border, melanomas may have an irregular borderC - Color variegation - Most moles have one or two colors which are symmetric (e.g. darker center lighter around the edges), melanomas may                     have several irregularly placed colors (e.g. brown, black, red, white)D - Diameter - Most moles are less than 6 mm (size of a pencil eraser), melanomas can be larger (or smaller)E - Evolution - Melanoma can grow rapidly so watch out for any rapidly growing spot, even ones without brown color (some melanomas are skin colored and some are red) The growth of hair within moles is normal, is not concerning and it does not increase the risk of melanoma. Always protect yourself from the suns rays as it increases the risk of melanoma. Do NOT use tanning beds, they increase the risk even more!Please call for reevaluation if you develop new spots on your skin that you are concerned about, or if any of your skin spots change in color, start to bleed, become tender to touch or change in any other way.

## 2023-08-03 IMAGING — MR COLUNAS LOMBAR
1 series · 9 of 9 positions shown · non-contrast
Comparison: none

[Series 1: localizador · U · 2 acquisitions, 9 frames shown]
[im 1/2]
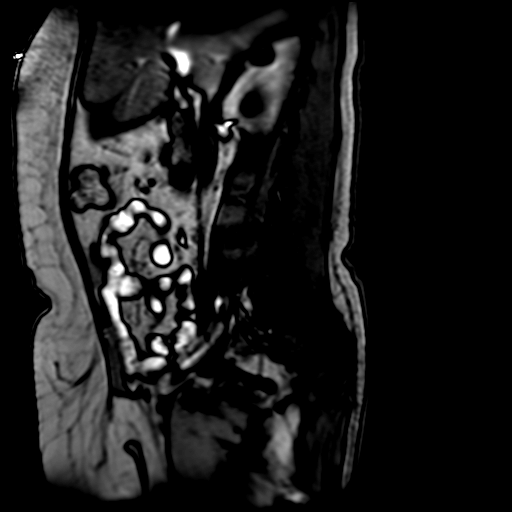
[im 1/2]
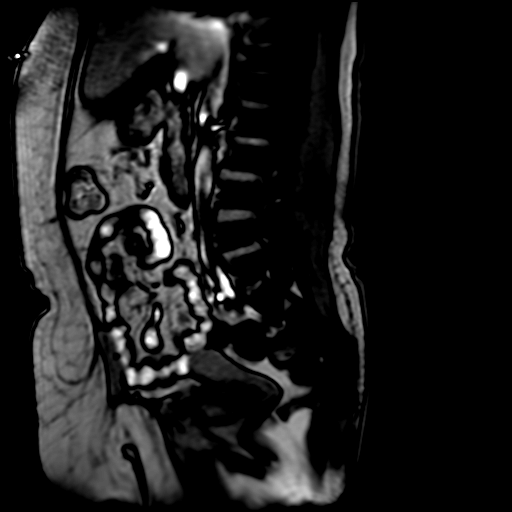
[im 1/2]
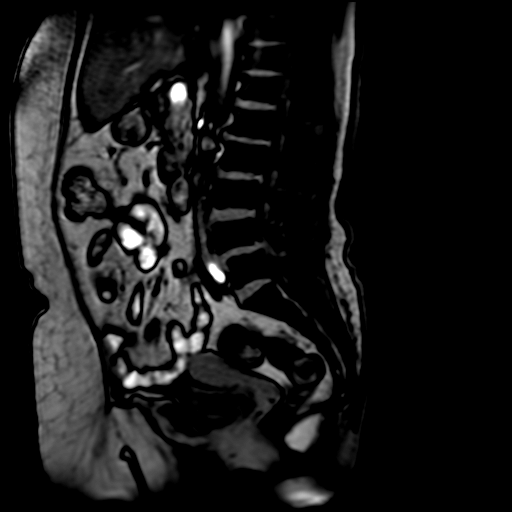
[im 1/2]
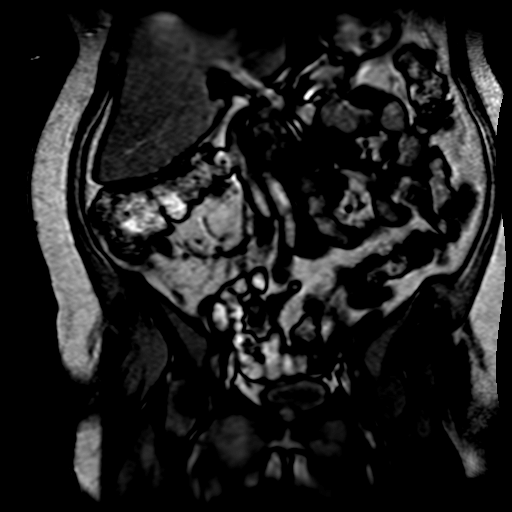
[im 1/2]
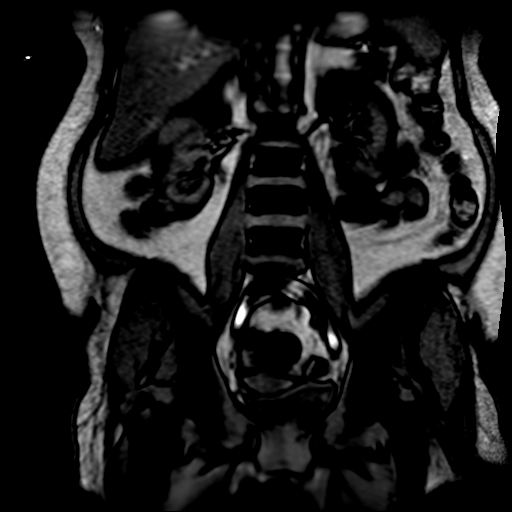
[im 1/2]
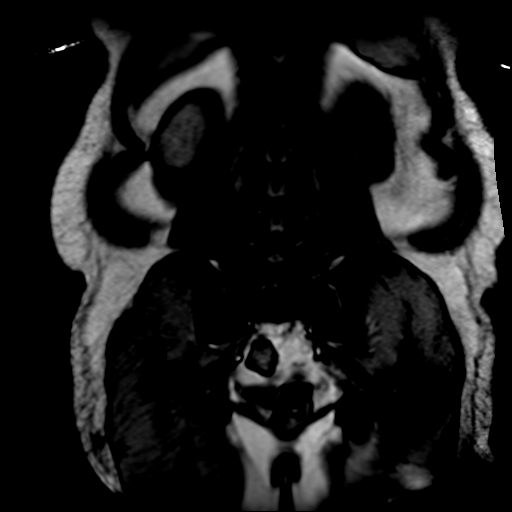
[im 1/2]
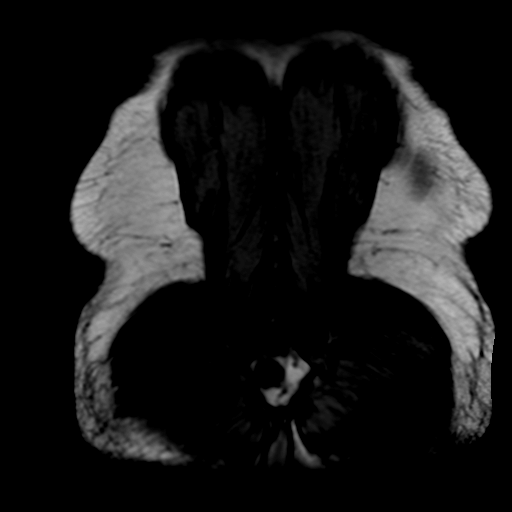
[im 2/2]
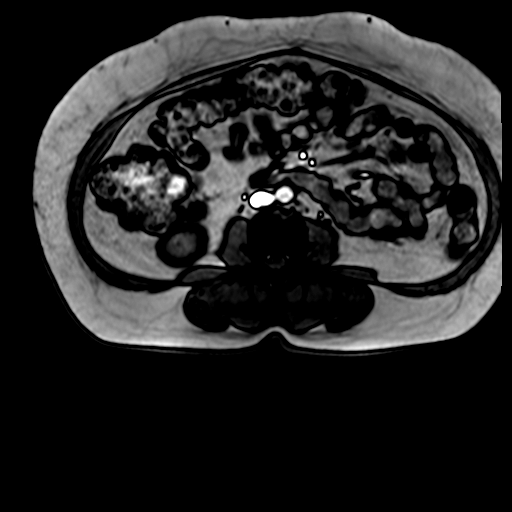
[im 2/2]
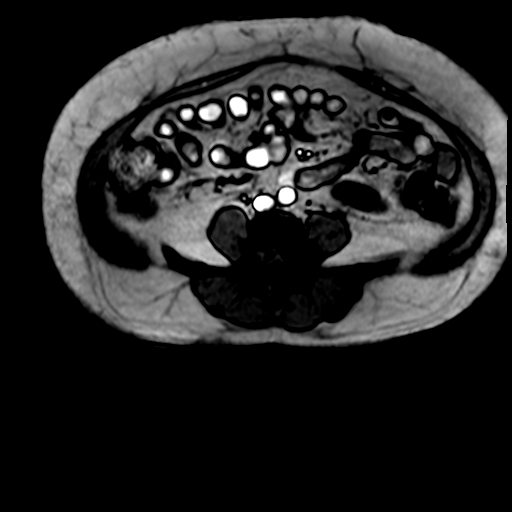

[9 of 9 positions shown; findings below may reference images not displayed]

[facility]olic[facility]
TÉCNICA DE EXAME:
As imagens de ressonância magnética foram obtidas com técnica FSE, ponderações T1 e T2 sem contraste.
OS SEGUINTES ASPECTOS FORAM OBSERVADOS:
Os corpos vertebrais apresentam altura preservada.
Anterolistese degenerativa grau I de L4 sobre L5.
Canal estreito degenerativo L4-L5.
RESSONÂNCIA MAGNÉTICA DA COLUNA LOMBAR
Sinais de espondilose caracterizada por reações osteofitárias marginais.
Nódulos de Schmorl com aspecto crônico lombares.
Medula óssea heterogênea das vértebras avaliadas.
Desidratação discal difusa.
Em L1-L2, L2-L3, L3-L4, L4-L5, L5-S1: Abaulamento discal difuso que comprime a face ventral do saco dural
com insinuação para as bases foraminais, mais acentuado em L4-L5.
Sinais de artrose facetária baixa.
O cone medular ‘e tópico, apresentando calibre e intensidade de sinal preservados.
Musculatura paravertebral sem anormalidades significativas.   
IMPRESSÃO DIAGNÓSTICA:
Avaliação por ressonância magnética da coluna lombar revelando:
 Anterolistese degenerativa grau I de L4 sobre L5.
Canal estreito degenerativo L4-L5.
[facility]ico [facility]olicitante[facility]
Sinais de espondilose caracterizada por reações osteofitárias marginais.
Nódulos de Schmorl com aspecto crônico lombares.
Medula óssea heterogênea das vértebras avaliadas.
Desidratação discal difusa.
Em L1-L2, L2-L3, L3-L4, L4-L5, L5-S1: Abaulamento discal difuso que comprime a face ventral do saco dural
com insinuação para as bases foraminais, mais acentuado em L4-L5.
Sinais de artrose facetária baixa.

## 2024-02-24 ENCOUNTER — Encounter: Admit: 2024-02-24 | Payer: PRIVATE HEALTH INSURANCE
